# Patient Record
Sex: Male | Born: 2000 | Hispanic: Yes | Marital: Single | Smoking: Never smoker
Health system: Southern US, Community
[De-identification: ages and names within clinical notes are randomized; demographics above are authoritative.]

## PROBLEM LIST (undated history)

## (undated) DIAGNOSIS — N3944 Nocturnal enuresis: Secondary | ICD-10-CM

## (undated) HISTORY — DX: Nocturnal enuresis: N39.44

---
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
## 2021-06-13 ENCOUNTER — Other Ambulatory Visit: Payer: No Typology Code available for payment source

## 2021-06-19 ENCOUNTER — Other Ambulatory Visit: Payer: No Typology Code available for payment source

## 2023-08-06 ENCOUNTER — Ambulatory Visit: Payer: Self-pay | Admitting: Psychology

## 2023-08-06 DIAGNOSIS — F4010 Social phobia, unspecified: Secondary | ICD-10-CM

## 2023-08-06 DIAGNOSIS — Z09 Encounter for follow-up examination after completed treatment for conditions other than malignant neoplasm: Secondary | ICD-10-CM

## 2023-10-07 NOTE — Progress Notes (Signed)
 Case Management 08/06/2023  Client came in today to consider therapeutical services at [facility]. He reported that he had seen a therapist in the past and that he was diagnosed with social anxiety disorder at that time. Mom had originally called the social work department because she reported that son had been really anxious and wanted him to seek care. Client reported that this was not true but that he did feel therapy had been helpful and thought that he would like to continue the work. Client stated that if he wanted to continue services that he would contact us  directly. Social work department will check back in a couple weeks to see if the client would like to continue with services.    Camie Norris T [place], LCSWA
